# Patient Record
Sex: Male | Born: 1980 | Race: Black or African American | Hispanic: No | Marital: Single | Smoking: Current some day smoker
Health system: Southern US, Community
[De-identification: ages and names within clinical notes are randomized; demographics above are authoritative.]

## PROBLEM LIST (undated history)

## (undated) DIAGNOSIS — H00019 Hordeolum externum unspecified eye, unspecified eyelid: Secondary | ICD-10-CM

---

## 2009-11-27 ENCOUNTER — Emergency Department (HOSPITAL_COMMUNITY): Admission: EM | Admit: 2009-11-27 | Discharge: 2009-11-27 | Payer: Self-pay | Admitting: Emergency Medicine

## 2010-01-14 ENCOUNTER — Emergency Department (HOSPITAL_COMMUNITY): Admission: EM | Admit: 2010-01-14 | Discharge: 2010-01-14 | Payer: Self-pay | Admitting: Emergency Medicine

## 2010-10-18 ENCOUNTER — Emergency Department (HOSPITAL_COMMUNITY)
Admission: EM | Admit: 2010-10-18 | Discharge: 2010-10-18 | Disposition: A | Discharge status: Home / Self Care | Payer: Self-pay | Attending: Emergency Medicine | Admitting: Emergency Medicine

## 2010-10-18 DIAGNOSIS — K5289 Other specified noninfective gastroenteritis and colitis: Secondary | ICD-10-CM | POA: Insufficient documentation

## 2010-10-18 DIAGNOSIS — R197 Diarrhea, unspecified: Secondary | ICD-10-CM | POA: Insufficient documentation

## 2010-10-18 DIAGNOSIS — R1110 Vomiting, unspecified: Secondary | ICD-10-CM | POA: Insufficient documentation

## 2010-10-31 ENCOUNTER — Emergency Department (HOSPITAL_COMMUNITY)
Admission: EM | Admit: 2010-10-31 | Discharge: 2010-10-31 | Disposition: A | Discharge status: Home / Self Care | Payer: Self-pay | Attending: Emergency Medicine | Admitting: Emergency Medicine

## 2010-10-31 DIAGNOSIS — L42 Pityriasis rosea: Secondary | ICD-10-CM | POA: Insufficient documentation

## 2010-10-31 DIAGNOSIS — L2989 Other pruritus: Secondary | ICD-10-CM | POA: Insufficient documentation

## 2010-10-31 DIAGNOSIS — L298 Other pruritus: Secondary | ICD-10-CM | POA: Insufficient documentation

## 2011-06-19 ENCOUNTER — Emergency Department (HOSPITAL_COMMUNITY): Payer: Self-pay

## 2011-06-19 ENCOUNTER — Emergency Department (HOSPITAL_COMMUNITY)
Admission: EM | Admit: 2011-06-19 | Discharge: 2011-06-19 | Disposition: A | Discharge status: Home / Self Care | Payer: Self-pay | Attending: Emergency Medicine | Admitting: Emergency Medicine

## 2011-06-19 DIAGNOSIS — R059 Cough, unspecified: Secondary | ICD-10-CM | POA: Insufficient documentation

## 2011-06-19 DIAGNOSIS — A088 Other specified intestinal infections: Secondary | ICD-10-CM | POA: Insufficient documentation

## 2011-06-19 DIAGNOSIS — R197 Diarrhea, unspecified: Secondary | ICD-10-CM | POA: Insufficient documentation

## 2011-06-19 DIAGNOSIS — J3489 Other specified disorders of nose and nasal sinuses: Secondary | ICD-10-CM | POA: Insufficient documentation

## 2011-06-19 DIAGNOSIS — K117 Disturbances of salivary secretion: Secondary | ICD-10-CM | POA: Insufficient documentation

## 2011-06-19 DIAGNOSIS — R220 Localized swelling, mass and lump, head: Secondary | ICD-10-CM | POA: Insufficient documentation

## 2011-06-19 DIAGNOSIS — R112 Nausea with vomiting, unspecified: Secondary | ICD-10-CM | POA: Insufficient documentation

## 2011-06-19 DIAGNOSIS — R630 Anorexia: Secondary | ICD-10-CM | POA: Insufficient documentation

## 2011-06-19 DIAGNOSIS — A084 Viral intestinal infection, unspecified: Secondary | ICD-10-CM

## 2011-06-19 DIAGNOSIS — R05 Cough: Secondary | ICD-10-CM | POA: Insufficient documentation

## 2011-06-19 DIAGNOSIS — F17200 Nicotine dependence, unspecified, uncomplicated: Secondary | ICD-10-CM | POA: Insufficient documentation

## 2011-06-19 DIAGNOSIS — R109 Unspecified abdominal pain: Secondary | ICD-10-CM | POA: Insufficient documentation

## 2011-06-19 MED ORDER — ONDANSETRON 4 MG PO TBDP
8.0000 mg | ORAL_TABLET | Freq: Once | ORAL | Status: AC
  Administered 2011-06-19: 8 mg via ORAL
  Filled 2011-06-19: qty 1

## 2011-06-19 NOTE — ED Notes (Signed)
Pt resting quietly in bed. Pt tolerating fluids well. No vomiting noted.

## 2011-06-19 NOTE — ED Provider Notes (Signed)
History     CSN: [id number] Arrival date & time: 06/19/2011  3:46 PM   First MD Initiated Contact with Patient 06/19/11 1643      Chief Complaint  Patient presents with  . Abdominal Cramping    began yesterday, diahreea, abdominal cramping and n/v    (Consider location/radiation/quality/duration/timing/severity/associated sxs/prior treatment) Patient is a 30 y.o. male presenting with cramps. The history is provided by the patient.  Abdominal Cramping The primary symptoms of the illness include nausea, vomiting and diarrhea. The primary symptoms of the illness do not include abdominal pain or shortness of breath. The current episode started yesterday. The onset of the illness was sudden. The problem has not changed since onset. Vomiting occurs 2 to 5 times per day. The emesis contains stomach contents.  The diarrhea began today. The diarrhea is watery. The diarrhea occurs 2 to 4 times per day.  Additional symptoms associated with the illness include anorexia.    History reviewed. No pertinent past medical history.  History reviewed. No pertinent past surgical history.  History reviewed. No pertinent family history.  History  Substance Use Topics  . Smoking status: Current Everyday Smoker  . Smokeless tobacco: Not on file  . Alcohol Use: No      Review of Systems  HENT: Positive for congestion and rhinorrhea. Negative for ear discharge.   Respiratory: Positive for cough. Negative for shortness of breath.   Gastrointestinal: Positive for nausea, vomiting, diarrhea and anorexia. Negative for abdominal pain.  All other systems reviewed and are negative.    Allergies  Review of patient's allergies indicates no known allergies.  Home Medications   Current Outpatient Rx  Name Route Sig Dispense Refill  . THERA M PLUS PO TABS Oral Take 1 tablet by mouth daily.        BP 128/76  Pulse 75  Temp(Src) 97.8 F (36.6 C) (Oral)  Resp 14  Ht 5\' 10"  (1.778 m)  Wt 190 lb  (86.183 kg)  BMI 27.26 kg/m2  SpO2 100%  Physical Exam  Nursing note and vitals reviewed. Constitutional: He is oriented to person, place, and time. He appears well-developed and well-nourished. No distress.  HENT:  Head: Normocephalic and atraumatic.  Right Ear: Tympanic membrane and ear canal normal.  Left Ear: Tympanic membrane and ear canal normal.  Nose: Mucosal edema and rhinorrhea present.  Mouth/Throat: Mucous membranes are dry.  Eyes: Conjunctivae and EOM are normal. Pupils are equal, round, and reactive to light.  Neck: Normal range of motion. Neck supple.  Cardiovascular: Normal rate, regular rhythm and intact distal pulses.   No murmur heard. Pulmonary/Chest: Effort normal and breath sounds normal. No respiratory distress. He has no wheezes. He has no rales.  Abdominal: Soft. He exhibits no distension. There is no tenderness. There is no rebound and no guarding.  Musculoskeletal: Normal range of motion. He exhibits no edema and no tenderness.  Neurological: He is alert and oriented to person, place, and time.  Skin: Skin is warm and dry. No rash noted. No erythema.  Psychiatric: He has a normal mood and affect. His behavior is normal.    ED Course  Procedures (including critical care time)  Labs Reviewed - No data to display Dg Abd Acute W/chest  06/19/2011  *RADIOLOGY REPORT*  Clinical Data: Abdominal pain, nausea vomiting.  ACUTE ABDOMEN SERIES (ABDOMEN 2 VIEW & CHEST 1 VIEW)  Comparison: None  Findings: The upright chest x-ray is normal.  Two views of the abdomen demonstrate an unremarkable bowel  gas pattern.  There is scattered air in the colon and small bowel but no distention or air fluid levels.  Findings could be due to gastroenteritis.  No free air.  The soft tissue shadows are maintained.  The bony structures are intact.  IMPRESSION:  1.  No acute cardiopulmonary findings. 2.  No plain film findings for obstruction or perforation.  Original Report Authenticated  By: P. Loralie Champagne, M.D.     No diagnosis found.    MDM   Pt with symptoms most consistent with a viral process with respiratory sx/vomitting/diarrhea.  Denies bad food exposure and recent travel out of the country.  No recent abx.  No hx concerning for GU pathology or kidney stones.  Pt is awake and alert on exam without peritoneal signs.  AAS neg and after zofran tolerating po's.  Will d/c home to continue oral hydration.          Gwyneth Sprout, MD 06/19/11 [phone]

## 2011-10-20 ENCOUNTER — Encounter (HOSPITAL_COMMUNITY): Payer: Self-pay | Admitting: *Deleted

## 2011-10-20 ENCOUNTER — Emergency Department (HOSPITAL_COMMUNITY)
Admission: EM | Admit: 2011-10-20 | Discharge: 2011-10-20 | Disposition: A | Discharge status: Home / Self Care | Payer: Self-pay | Attending: Emergency Medicine | Admitting: Emergency Medicine

## 2011-10-20 DIAGNOSIS — R197 Diarrhea, unspecified: Secondary | ICD-10-CM | POA: Insufficient documentation

## 2011-10-20 DIAGNOSIS — IMO0001 Reserved for inherently not codable concepts without codable children: Secondary | ICD-10-CM | POA: Insufficient documentation

## 2011-10-20 DIAGNOSIS — J3489 Other specified disorders of nose and nasal sinuses: Secondary | ICD-10-CM | POA: Insufficient documentation

## 2011-10-20 DIAGNOSIS — J069 Acute upper respiratory infection, unspecified: Secondary | ICD-10-CM | POA: Insufficient documentation

## 2011-10-20 DIAGNOSIS — R509 Fever, unspecified: Secondary | ICD-10-CM | POA: Insufficient documentation

## 2011-10-20 DIAGNOSIS — R059 Cough, unspecified: Secondary | ICD-10-CM | POA: Insufficient documentation

## 2011-10-20 DIAGNOSIS — A6000 Herpesviral infection of urogenital system, unspecified: Secondary | ICD-10-CM | POA: Insufficient documentation

## 2011-10-20 DIAGNOSIS — R05 Cough: Secondary | ICD-10-CM | POA: Insufficient documentation

## 2011-10-20 DIAGNOSIS — R1110 Vomiting, unspecified: Secondary | ICD-10-CM | POA: Insufficient documentation

## 2011-10-20 MED ORDER — ACYCLOVIR 400 MG PO TABS: 400.0000 mg | ORAL_TABLET | Freq: Four times a day (QID) | ORAL | Status: AC

## 2011-10-20 MED ORDER — PROMETHAZINE HCL 25 MG PO TABS: 25.0000 mg | ORAL_TABLET | Freq: Four times a day (QID) | ORAL | Status: AC | PRN

## 2011-10-20 NOTE — Discharge Instructions (Signed)
Genital Herpes Genital herpes is a sexually transmitted disease. This means that it is a disease passed by having sex with an infected person. There is no cure for genital herpes. The time between attacks can be months to years. The virus may live in a person but produce no problems (symptoms). This infection can be passed to a baby as it travels down the birth canal (vagina). In a newborn, this can cause central nervous system damage, eye damage, or even death. The virus that causes genital herpes is usually HSV-2 virus. The virus that causes oral herpes is usually HSV-1. The diagnosis (learning what is wrong) is made through culture results. SYMPTOMS  Usually symptoms of pain and itching begin a few days to a week after contact. It first appears as small blisters that progress to small painful ulcers which then scab over and heal after several days. It affects the outer genitalia, birth canal, cervix, penis, anal area, buttocks, and thighs. HOME CARE INSTRUCTIONS   Keep ulcerated areas dry and clean.   Take medications as directed. Antiviral medications can speed up healing. They will not prevent recurrences or cure this infection. These medications can also be taken for suppression if there are frequent recurrences.   While the infection is active, it is contagious. Avoid all sexual contact during active infections.   Condoms may help prevent spread of the herpes virus.   Practice safe sex.   Wash your hands thoroughly after touching the genital area.   Avoid touching your eyes after touching your genital area.   Inform your caregiver if you have had genital herpes and become pregnant. It is your responsibility to insure a safe outcome for your baby in this pregnancy.   Only take over-the-counter or prescription medicines for pain, discomfort, or fever as directed by your caregiver.  SEEK MEDICAL CARE IF:   You have a recurrence of this infection.   You do not respond to medications and  are not improving.   You have new sources of pain or discharge which have changed from the original infection.   You have an oral temperature above 102 F (38.9 C).   You develop abdominal pain.   You develop eye pain or signs of eye infection.  Document Released: 07/18/2000 Document Revised: 07/10/2011 Document Reviewed: 08/08/2009 [employer] Patient Information 2012 [employer]. 

## 2011-10-20 NOTE — ED Provider Notes (Signed)
History     CSN: [id number]  Arrival date & time 10/20/11  1500   First MD Initiated Contact with Patient 10/20/11 1632      Chief Complaint  Patient presents with  . Cough  . Nasal Congestion     Patient is a 31 y.o. male presenting with cough. The history is provided by the patient.  Cough   the patient reports cough and congestion and myalgias and chills for 4 days.  He reports diarrhea.  He reports one episode of vomiting.  He denies chest pain or abdominal pain.  He has a fever at this time.  He reports no nausea at this time.  He reports a recent diagnosis of genital herpes and reports since developing his new upper respiratory tract infection he's had a significant outbreak of his genital herpes.  He no longer has acyclovir.  He requests a prescription for the acyclovir.  He has no primary care doctor.  Nothing worsens the symptoms.  Nothing improves his symptoms.  Symptoms are constant.  History reviewed. No pertinent past medical history.  History reviewed. No pertinent past surgical history.  History reviewed. No pertinent family history.  History  Substance Use Topics  . Smoking status: Current Everyday Smoker  . Smokeless tobacco: Not on file  . Alcohol Use: Yes      Review of Systems  Respiratory: Positive for cough.   All other systems reviewed and are negative.    Allergies  Review of patient's allergies indicates no known allergies.  Home Medications   Current Outpatient Rx  Name Route Sig Dispense Refill  . PSEUDOEPH-DOXYLAMINE-DM-APAP 60-7.12-31-998 MG/30ML PO LIQD Oral Take 30 mLs by mouth every 4 (four) hours as needed. For cold      BP 126/85  Pulse 86  Temp(Src) 98.1 F (36.7 C) (Oral)  Resp 16  SpO2 97%  Physical Exam  Nursing note and vitals reviewed. Constitutional: He is oriented to person, place, and time. He appears well-developed and well-nourished.  HENT:  Head: Normocephalic and atraumatic.       Posterior pharynx normal.   Tolerating secretions.  Eyes: EOM are normal.  Neck: Normal range of motion.  Cardiovascular: Normal rate, regular rhythm, normal heart sounds and intact distal pulses.   Pulmonary/Chest: Effort normal and breath sounds normal. No respiratory distress.  Abdominal: Soft. He exhibits no distension. There is no tenderness.  Musculoskeletal: Normal range of motion.  Neurological: He is alert and oriented to person, place, and time.  Skin: Skin is warm and dry.  Psychiatric: He has a normal mood and affect. Judgment normal.    ED Course  Procedures (including critical care time)  Labs Reviewed - No data to display No results found.   1. Upper respiratory tract infection   2. Genital herpes       MDM  Symptoms sound viral nature.  I suspect the reason he is really here is because of this herpetic outbreak.  He'll be sent home on treatment dose acyclovir.        Lyanne Co, MD 10/20/11 [phone]

## 2011-10-20 NOTE — ED Notes (Signed)
To ed for eval of cough and congestion for past 10 days. otc meds not working. Appears in nad

## 2012-03-10 ENCOUNTER — Emergency Department (HOSPITAL_COMMUNITY)
Admission: EM | Admit: 2012-03-10 | Discharge: 2012-03-10 | Disposition: A | Discharge status: Home / Self Care | Payer: Self-pay | Attending: Emergency Medicine | Admitting: Emergency Medicine

## 2012-03-10 ENCOUNTER — Encounter (HOSPITAL_COMMUNITY): Payer: Self-pay | Admitting: Emergency Medicine

## 2012-03-10 DIAGNOSIS — Z833 Family history of diabetes mellitus: Secondary | ICD-10-CM | POA: Insufficient documentation

## 2012-03-10 DIAGNOSIS — L03213 Periorbital cellulitis: Secondary | ICD-10-CM

## 2012-03-10 DIAGNOSIS — Z8249 Family history of ischemic heart disease and other diseases of the circulatory system: Secondary | ICD-10-CM | POA: Insufficient documentation

## 2012-03-10 DIAGNOSIS — H00039 Abscess of eyelid unspecified eye, unspecified eyelid: Secondary | ICD-10-CM | POA: Insufficient documentation

## 2012-03-10 DIAGNOSIS — F17200 Nicotine dependence, unspecified, uncomplicated: Secondary | ICD-10-CM | POA: Insufficient documentation

## 2012-03-10 HISTORY — DX: Hordeolum externum unspecified eye, unspecified eyelid: H00.019

## 2012-03-10 MED ORDER — CEPHALEXIN 500 MG PO CAPS: 500.0000 mg | ORAL_CAPSULE | Freq: Four times a day (QID) | ORAL | Status: DC

## 2012-03-10 NOTE — ED Notes (Signed)
R/eye lid swollen and red x 2 days

## 2012-03-10 NOTE — ED Provider Notes (Signed)
History     CSN: [id number]  Arrival date & time 03/10/12  1243   First MD Initiated Contact with Patient 03/10/12 1351      Chief Complaint  Patient presents with  . Facial Swelling    R/eye lid swollen and painful x 2 days    (Consider location/radiation/quality/duration/timing/severity/associated sxs/prior treatment) HPI Comments: Pt presents with right eyelid swelling. The swelling began three days ago and started as a small bump towards the center edge of his eyelid and has since become enlarged and swollen. Pt states his pain is a 5/10 and is only present when touching the area or when leaning forward. He has used ibuprofen and also a warm compress on the area but they have been ineffective at reducing swelling. Pt denies any loss or change in vision, pain or discharge in the eye, fever, ear pain, sore throat, sinus pressure or headache. The history is provided by the patient.    Past Medical History  Diagnosis Date  . Stye     History reviewed. No pertinent past surgical history.  Family History  Problem Relation Age of Onset  . Diabetes Other   . Hypertension Other     History  Substance Use Topics  . Smoking status: Current Some Day Smoker  . Smokeless tobacco: Not on file  . Alcohol Use: Yes      Review of Systems  Constitutional: Negative for fever and chills.  HENT: Negative for congestion, sore throat and sinus pressure.   Eyes: Negative for photophobia, pain, discharge, redness, itching and visual disturbance.  Respiratory: Negative for cough and shortness of breath.     Allergies  Review of patient's allergies indicates no known allergies.  Home Medications  No current outpatient prescriptions on file.  BP 117/65  Pulse 75  Temp 97.6 F (36.4 C) (Oral)  Resp 18  SpO2 98%  Physical Exam  Nursing note and vitals reviewed. Constitutional: He appears well-developed and well-nourished. No distress.  HENT:  Head: Normocephalic and atraumatic.    Eyes: Conjunctivae and EOM are normal. Pupils are equal, round, and reactive to light. Right eye exhibits no discharge. Left eye exhibits no discharge. Right conjunctiva is not injected. Right conjunctiva has no hemorrhage. Left conjunctiva is not injected. Left conjunctiva has no hemorrhage. No scleral icterus. Right eye exhibits normal extraocular motion and no nystagmus. Left eye exhibits normal extraocular motion and no nystagmus. Pupils are equal.       Right eyelid erythematous, edematous, tender to palpation.  No discharge.  Possible central nodule within edematous area.    Neck: Neck supple.  Pulmonary/Chest: Effort normal.  Neurological: He is alert.  Skin: He is not diaphoretic.    ED Course  Procedures (including critical care time)  Labs Reviewed - No data to display No results found.   1. Preseptal cellulitis       MDM  Pt with erythema, edema, tenderness of right eyelid.  Possible sty, but concern for preseptal cellulitis.  Clinically no concern for orbital cellulitis.  Discussed diagnosis and care instructions with patient.  Pt declines pain medication, states he would prefer to use ibuprofen, tylenol at home.  Pt given return precautions.  Pt verbalizes understanding and agrees with plan.           [place], [place] 03/10/12 1500

## 2012-03-11 ENCOUNTER — Encounter (HOSPITAL_COMMUNITY): Payer: Self-pay | Admitting: Emergency Medicine

## 2012-03-11 ENCOUNTER — Emergency Department (HOSPITAL_COMMUNITY)
Admission: EM | Admit: 2012-03-11 | Discharge: 2012-03-11 | Disposition: A | Discharge status: Home / Self Care | Payer: Self-pay | Attending: Emergency Medicine | Admitting: Emergency Medicine

## 2012-03-11 DIAGNOSIS — F17200 Nicotine dependence, unspecified, uncomplicated: Secondary | ICD-10-CM | POA: Insufficient documentation

## 2012-03-11 DIAGNOSIS — H00019 Hordeolum externum unspecified eye, unspecified eyelid: Secondary | ICD-10-CM | POA: Insufficient documentation

## 2012-03-11 NOTE — ED Provider Notes (Signed)
Medical screening examination/treatment/procedure(s) were performed by non-physician practitioner and as supervising physician I was immediately available for consultation/collaboration.   Loren Racer, MD 03/11/12 [phone]

## 2012-03-11 NOTE — ED Provider Notes (Signed)
Medical screening examination/treatment/procedure(s) were performed by non-physician practitioner and as supervising physician I was immediately available for consultation/collaboration.  Cathleen Yagi, MD 03/11/12 2346 

## 2012-03-11 NOTE — ED Notes (Signed)
Pt c/o right eye pain and swelling which began on Sunday. Pt reports coming to ED yesterday, however reports no relief in symptoms. Pt denies blurry or double vision. NAD

## 2012-03-11 NOTE — ED Provider Notes (Signed)
History     CSN: [id number]  Arrival date & time 03/11/12  1430   First MD Initiated Contact with Patient 03/11/12 1556      Chief Complaint  Patient presents with  . Facial Swelling    (Consider location/radiation/quality/duration/timing/severity/associated sxs/prior treatment) HPI Comments: 31 y/o male presents with right eyelid pain and swelling since Sunday. He was seen in the ED yesterday and prescribed keflex which he began last night. He has been applying warm compresses. 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 He believes this is the case and would like a note to be out of work. Denies any visual changes, blurred vision, double vision, loss of vision, headache, lightheadedness, dizziness, fever, chills, rashes, nausea.  The history is provided by the patient.    Past Medical History  Diagnosis Date  . Stye     History reviewed. No pertinent past surgical history.  Family History  Problem Relation Age of Onset  . Diabetes Other   . Hypertension Other     History  Substance Use Topics  . Smoking status: Current Some Day Smoker  . Smokeless tobacco: Not on file  . Alcohol Use: Yes      Review of Systems  Constitutional: Negative for fever and chills.  HENT: Negative for sore throat.   Eyes: Positive for pain. Negative for photophobia, discharge, redness and visual disturbance.  Gastrointestinal: Negative for nausea.  Skin: Negative for rash.  Neurological: Negative for dizziness, light-headedness and headaches.    Allergies  Review of patient's allergies indicates no known allergies.  Home Medications   Current Outpatient Rx  Name Route Sig Dispense Refill  . CEPHALEXIN 500 MG PO CAPS Oral Take 500 mg by mouth 4 (four) times daily.      BP 114/71  Pulse 83  Temp 98.3 F (36.8 C) (Oral)  Resp 18  Ht  5\' 10"  (1.778 m)  Wt 195 lb (88.451 kg)  BMI 27.98 kg/m2  SpO2 98%  Physical Exam  Nursing note and vitals reviewed. Constitutional: He is oriented to person, place, and time. He appears well-developed and well-nourished. No distress.  HENT:  Head: Normocephalic and atraumatic.  Nose: Nose normal.  Mouth/Throat: Oropharynx is clear and moist. No oropharyngeal exudate.  Eyes: Conjunctivae and EOM are normal. Pupils are equal, round, and reactive to light. Right eye exhibits hordeolum. Right eye exhibits no chemosis, no discharge and no exudate.       No periorbital edema  Neck: Neck supple.  Cardiovascular: Normal rate, regular rhythm and normal heart sounds.   Pulmonary/Chest: Effort normal and breath sounds normal.  Neurological: He is alert and oriented to person, place, and time.  Skin: No rash noted. There is erythema (over hordeolum).  Psychiatric: He has a normal mood and affect. His behavior is normal.    ED Course  Procedures (including critical care time)  Labs Reviewed - No data to display No results found.   1. Stye       MDM  31 y/o male with stye stating stye is not improving but not worsening. Has not been on abx for 24 hours yet. Admits he wants a work note. No visual disturbances present. No periorbital edema. Patient in NAD. Explained there are many causes for stye and not necessarily his work. Note given for today  only- patient aware. He will continue abx and warm compresses.        Trevor Mace, PA-C 03/11/12 1624

## 2012-03-22 ENCOUNTER — Encounter (HOSPITAL_COMMUNITY): Payer: Self-pay | Admitting: *Deleted

## 2012-03-22 ENCOUNTER — Emergency Department (HOSPITAL_COMMUNITY)
Admission: EM | Admit: 2012-03-22 | Discharge: 2012-03-22 | Disposition: A | Discharge status: Home / Self Care | Payer: Self-pay | Attending: Emergency Medicine | Admitting: Emergency Medicine

## 2012-03-22 DIAGNOSIS — J020 Streptococcal pharyngitis: Secondary | ICD-10-CM | POA: Insufficient documentation

## 2012-03-22 DIAGNOSIS — F17200 Nicotine dependence, unspecified, uncomplicated: Secondary | ICD-10-CM | POA: Insufficient documentation

## 2012-03-22 LAB — RAPID STREP SCREEN (MED CTR MEBANE ONLY): Streptococcus, Group A Screen (Direct): POSITIVE — AB

## 2012-03-22 MED ORDER — PENICILLIN G BENZATHINE 1200000 UNIT/2ML IM SUSP
1.2000 10*6.[IU] | Freq: Once | INTRAMUSCULAR | Status: AC
  Administered 2012-03-22: 1.2 10*6.[IU] via INTRAMUSCULAR
  Filled 2012-03-22: qty 2

## 2012-03-22 MED ORDER — HYDROCODONE-ACETAMINOPHEN 7.5-500 MG/15ML PO SOLN: 15.0000 mL | Freq: Four times a day (QID) | ORAL | Status: AC | PRN

## 2012-03-22 NOTE — ED Provider Notes (Signed)
History  This chart was scribed for Nathaniel Octave, MD by Shari Heritage. The patient was seen in room TR09C/TR09C. Patient's care was started at 1516.     CSN: [id number]  Arrival date & time 03/22/12  1516   First MD Initiated Contact with Patient 03/22/12 1549      Chief Complaint  Patient presents with  . Sore Throat    The history is provided by the patient. No language interpreter was used.   AN SCHNABEL is a 31 y.o. male who presents to the Emergency Department complaining of moderate to severe sore throat onset 4 days ago. Patient says that sore throat pain worsens when he swallows. Associated symptoms include body aches, mild fever and mild ear pain. Patient denies nausea or vomiting. No cough. Patient says that no OTC medications have provided relief. Patient has a recent history of a stye which he has been taking antibiotics for. He reports no other significant medical history.    Past Medical History  Diagnosis Date  . Stye     History reviewed. No pertinent past surgical history.  Family History  Problem Relation Age of Onset  . Diabetes Other   . Hypertension Other     History  Substance Use Topics  . Smoking status: Current Some Day Smoker  . Smokeless tobacco: Not on file  . Alcohol Use: Yes      Review of Systems A complete 10 system review of systems was obtained and all systems are negative except as noted in the HPI and PMH.   Allergies  Review of patient's allergies indicates no known allergies.  Home Medications  No current outpatient prescriptions on file.  BP 109/65  Pulse 86  Temp 99.1 F (37.3 C) (Oral)  Resp 18  SpO2 96%  Physical Exam  Constitutional: He is oriented to person, place, and time. He appears well-developed and well-nourished.  HENT:  Head: Normocephalic and atraumatic.  Mouth/Throat: Posterior oropharyngeal erythema present. No oropharyngeal exudate.       Mild erythema to the oropharynx. No asymmetry. No  exudate. No lymphadenopathy.  Musculoskeletal: Normal range of motion.  Neurological: He is alert and oriented to person, place, and time.  Skin: Skin is warm and dry.  Psychiatric: He has a normal mood and affect. His behavior is normal.    ED Course  Procedures (including critical care time) DIAGNOSTIC STUDIES: Oxygen Saturation is 96% on room air, adequate by my interpretation.    COORDINATION OF CARE: 3:52pm- Patient informed of current plan for treatment and evaluation and agrees with plan at this time. Took a culture to test for strep.   Labs Reviewed  RAPID STREP SCREEN - Abnormal; Notable for the following:    Streptococcus, Group A Screen (Direct) POSITIVE (*)     All other components within normal limits    No results found.   No diagnosis found.    MDM  Sore throat, body aches, subjective fevers and chills x4 days. Vital stable, no distress, no exudates or oropharyngeal asymmetry  Rapid strep positive.  No asymmetry. Tolerating PO. Will treat with bicillin.   I personally performed the services described in this documentation, which was scribed in my presence.  The recorded information has been reviewed and considered.    Nathaniel Octave, MD 03/22/12 [phone]

## 2012-03-22 NOTE — ED Notes (Addendum)
C/o sore throat, body aches, fever x 4 days. Very painful when swallowing. Denies cough, SOB.  Pain 10/10 when swallows

## 2012-03-22 NOTE — ED Notes (Signed)
Pt c/o sore throat x 4 days with fever and body aches

## 2012-08-20 IMAGING — CR DG ABDOMEN ACUTE W/ 1V CHEST
3 series · 3 of 3 positions shown · non-contrast
Comparison: None

CLINICAL DATA: Abdominal pain, nausea vomiting.

ACUTE ABDOMEN SERIES (ABDOMEN 2 VIEW & CHEST 1 VIEW)

[w chest pa *]
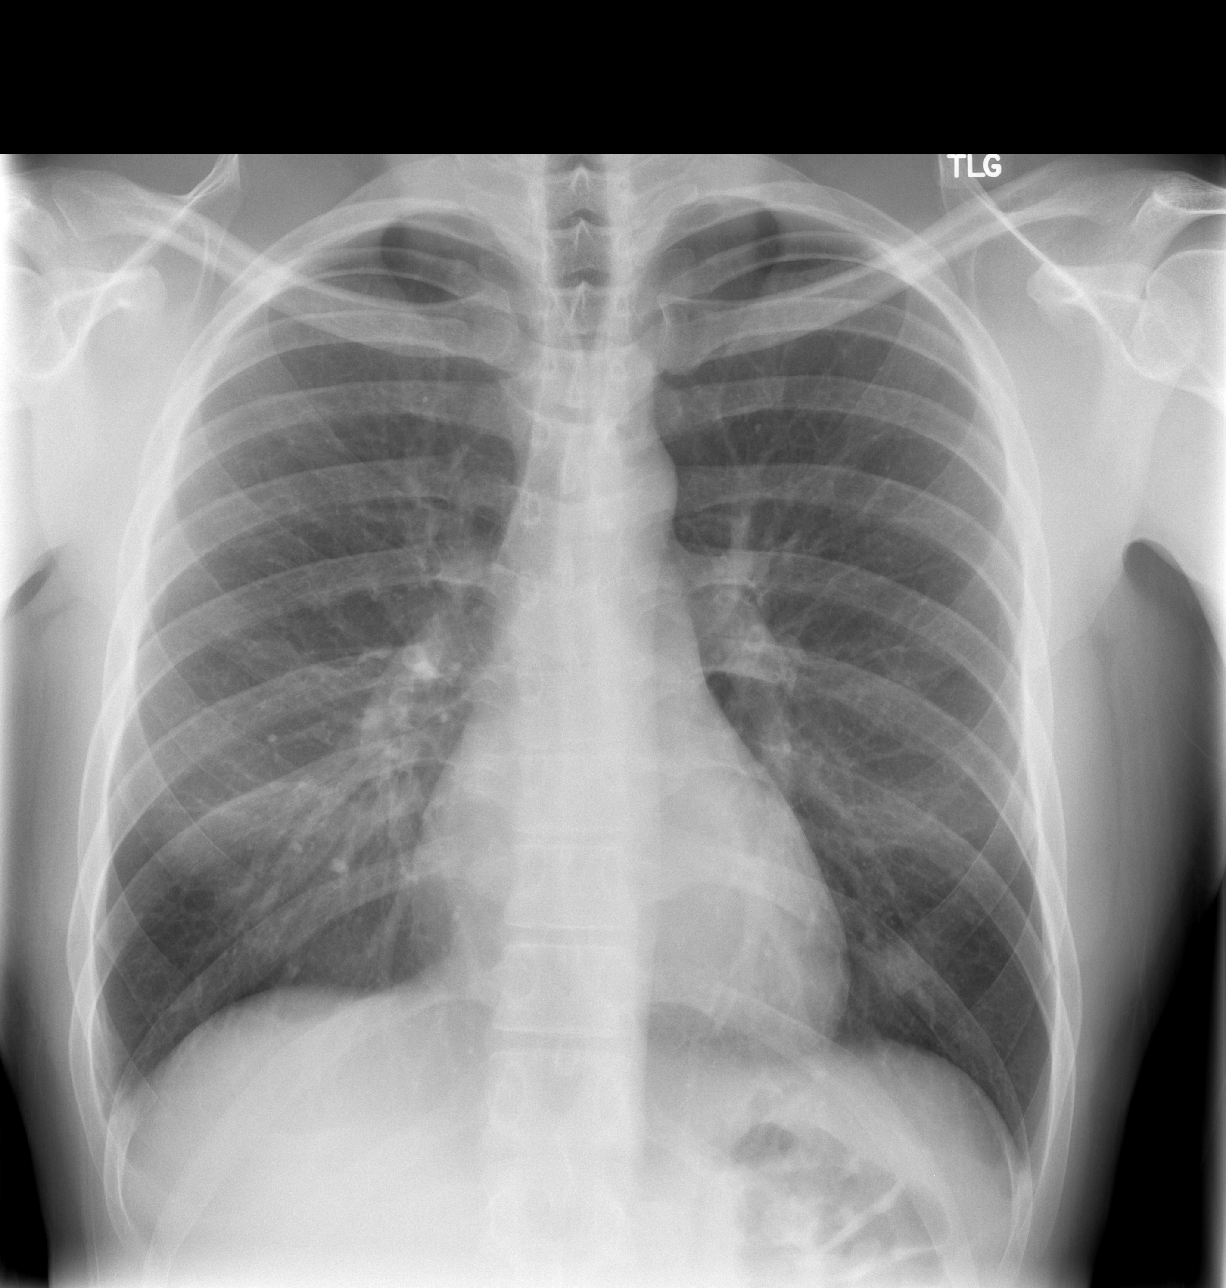

[w abdomen upright]
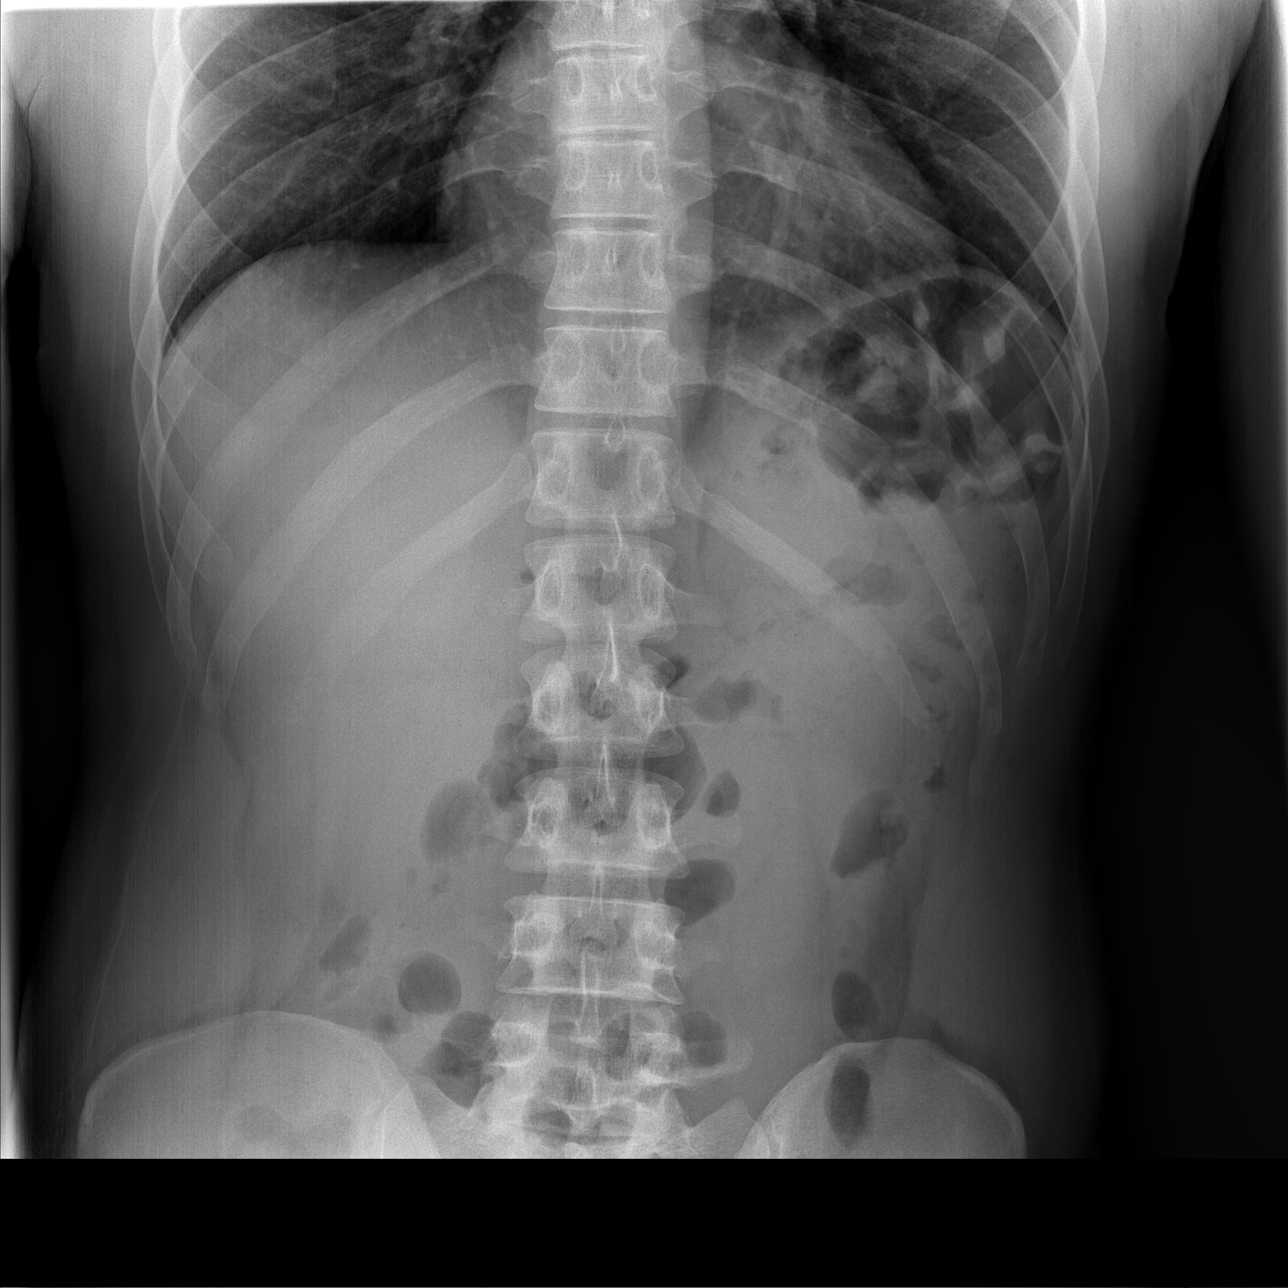

[t abdomen supine]
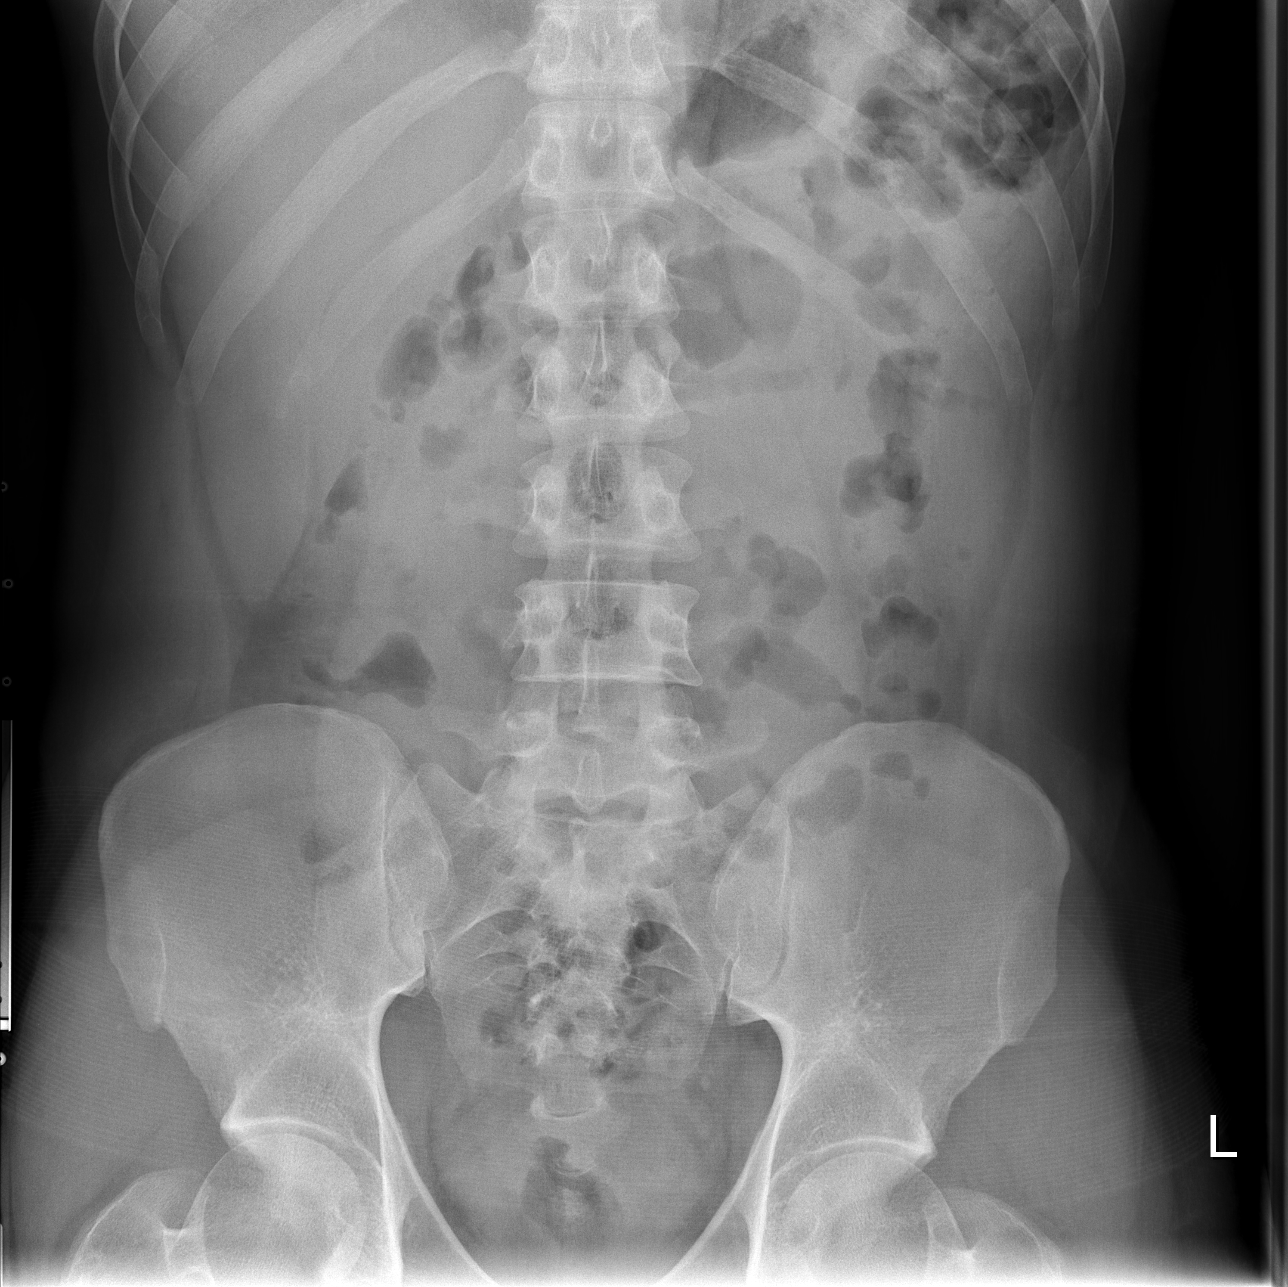

[3 of 3 positions shown; findings below may reference images not displayed]

FINDINGS: The upright chest x-ray is normal.

Two views of the abdomen demonstrate an unremarkable bowel gas
pattern.  There is scattered air in the colon and small bowel but
no distention or air fluid levels.  Findings could be due to
gastroenteritis.  No free air.  The soft tissue shadows are
maintained.  The bony structures are intact.
IMPRESSION: 1.  No acute cardiopulmonary findings.
2.  No plain film findings for obstruction or perforation.

## 2013-11-30 ENCOUNTER — Emergency Department (HOSPITAL_COMMUNITY)
Admission: EM | Admit: 2013-11-30 | Discharge: 2013-11-30 | Disposition: A | Discharge status: Home / Self Care | Payer: Self-pay | Attending: Emergency Medicine | Admitting: Emergency Medicine

## 2013-11-30 ENCOUNTER — Encounter (HOSPITAL_COMMUNITY): Payer: Self-pay | Admitting: Emergency Medicine

## 2013-11-30 DIAGNOSIS — R51 Headache: Secondary | ICD-10-CM | POA: Insufficient documentation

## 2013-11-30 DIAGNOSIS — F17200 Nicotine dependence, unspecified, uncomplicated: Secondary | ICD-10-CM | POA: Insufficient documentation

## 2013-11-30 DIAGNOSIS — H00023 Hordeolum internum right eye, unspecified eyelid: Secondary | ICD-10-CM

## 2013-11-30 DIAGNOSIS — H00029 Hordeolum internum unspecified eye, unspecified eyelid: Secondary | ICD-10-CM | POA: Insufficient documentation

## 2013-11-30 MED ORDER — ERYTHROMYCIN 5 MG/GM OP OINT: TOPICAL_OINTMENT | OPHTHALMIC | Status: AC

## 2013-11-30 MED ORDER — TETRACAINE HCL 0.5 % OP SOLN
2.0000 [drp] | Freq: Once | OPHTHALMIC | Status: AC
  Administered 2013-11-30: 2 [drp] via OPHTHALMIC
  Filled 2013-11-30: qty 2

## 2013-11-30 MED ORDER — FLUORESCEIN SODIUM 1 MG OP STRP
1.0000 | ORAL_STRIP | Freq: Once | OPHTHALMIC | Status: AC
  Administered 2013-11-30: 1 via OPHTHALMIC
  Filled 2013-11-30: qty 1

## 2013-11-30 NOTE — Discharge Instructions (Signed)
1. Medications: erythromycin, usual home medications 2. Treatment: rest, drink plenty of fluids,  3. Follow Up: Please followup with your primary doctor for discussion of your diagnoses and further evaluation after today's visit; if you do not have a primary care doctor use the resource guide provided to find one;     Sty A sty (hordeolum) is an infection of a gland in the eyelid located at the base of the eyelash. A sty may develop a white or yellow head of pus. It can be puffy (swollen). Usually, the sty will burst and pus will come out on its own. They do not leave lumps in the eyelid once they drain. A sty is often confused with another form of cyst of the eyelid called a chalazion. Chalazions occur within the eyelid and not on the edge where the bases of the eyelashes are. They often are red, sore and then form firm lumps in the eyelid. CAUSES   Germs (bacteria).  Lasting (chronic) eyelid inflammation. SYMPTOMS   Tenderness, redness and swelling along the edge of the eyelid at the base of the eyelashes.  Sometimes, there is a white or yellow head of pus. It may or may not drain. DIAGNOSIS  An ophthalmologist will be able to distinguish between a sty and a chalazion and treat the condition appropriately.  TREATMENT   Styes are typically treated with warm packs (compresses) until drainage occurs.  In rare cases, medicines that kill germs (antibiotics) may be prescribed. These antibiotics may be in the form of drops, cream or pills.  If a hard lump has formed, it is generally necessary to do a small incision and remove the hardened contents of the cyst in a minor surgical procedure done in the office.  In suspicious cases, your caregiver may send the contents of the cyst to the lab to be certain that it is not a rare, but dangerous form of cancer of the glands of the eyelid. HOME CARE INSTRUCTIONS   Wash your hands often and dry them with a clean towel. Avoid touching your eyelid.  This may spread the infection to other parts of the eye.  Apply heat to your eyelid for 10 to 20 minutes, several times a day, to ease pain and help to heal it faster.  Do not squeeze the sty. Allow it to drain on its own. Wash your eyelid carefully 3 to 4 times per day to remove any pus. SEEK IMMEDIATE MEDICAL CARE IF:   Your eye becomes painful or puffy (swollen).  Your vision changes.  Your sty does not drain by itself within 3 days.  Your sty comes back within a short period of time, even with treatment.  You have redness (inflammation) around the eye.  You have a fever. Document Released: 04/30/2005 Document Revised: 10/13/2011 Document Reviewed: 01/02/2009 [facility] Patient Information 2014 [employer], [place].

## 2013-11-30 NOTE — ED Notes (Signed)
Pt presents to department for evaluation of R eye pain. States he feels like something is stuck in R eye. 5/10 pain at the time. Pt is alert and oriented x4.

## 2013-11-30 NOTE — ED Provider Notes (Signed)
Medical screening examination/treatment/procedure(s) were performed by non-physician practitioner and as supervising physician I was immediately available for consultation/collaboration.   EKG Interpretation None       Ethelda ChickMartha K Linker, MD 11/30/13 [phone]

## 2013-11-30 NOTE — ED Provider Notes (Signed)
CSN: [id number]     Arrival date & time 11/30/13  1741 History  This chart was scribed for non-physician practitioner Dierdre ForthHannah Takako Minckler, PA-C working with Ethelda ChickMartha K Linker, MD by Dorothey Basemania Sutton, ED Scribe. This patient was seen in room TR04C/TR04C and the patient's care was started at 7:08 PM.    Chief Complaint  Patient presents with  . Eye Pain   The history is provided by the patient and medical records. No language interpreter was used.   HPI Comments: Nathaniel FlingHoward J Danko is a 33 y.o. male with a history of styes who presents to the Emergency Department complaining of a constant pain, described as a foreign body sensation and rated 5/10 currently, to the right eye onset earlier today. He states his current pain feels similar to his prior styes, but that he does not see one in the area. He reports some tearing from the eye with associated blurred vision and an associated, diffuse headache. He denies fever, chills. Patient has no other pertinent medical history.   Past Medical History  Diagnosis Date  . Stye    History reviewed. No pertinent past surgical history. Family History  Problem Relation Age of Onset  . Diabetes Other   . Hypertension Other    History  Substance Use Topics  . Smoking status: Current Some Day Smoker    Types: Cigarettes  . Smokeless tobacco: Not on file  . Alcohol Use: Yes     Comment: social    Review of Systems  Constitutional: Negative for fever and chills.  Eyes: Positive for pain and visual disturbance.  Neurological: Positive for headaches.  All other systems reviewed and are negative.  Allergies  Review of patient's allergies indicates no known allergies.  Home Medications   Prior to Admission medications   Not on File   Triage Vitals: BP 111/78  Pulse 79  Temp(Src) 97.7 F (36.5 C) (Oral)  Resp 18  SpO2 96%  Physical Exam  Nursing note and vitals reviewed. Constitutional: He appears well-developed and well-nourished. No distress.   Awake, alert, nontoxic appearance  HENT:  Head: Normocephalic and atraumatic.  Mouth/Throat: Oropharynx is clear and moist. No oropharyngeal exudate.  Eyes: Conjunctivae, EOM and lids are normal. Pupils are equal, round, and reactive to light. Lids are everted and swept, no foreign bodies found. Right eye exhibits hordeolum. Right eye exhibits no chemosis, no discharge and no exudate. No foreign body present in the right eye. Left eye exhibits no chemosis, no discharge, no exudate and no hordeolum. No foreign body present in the left eye. Right conjunctiva is not injected. Left conjunctiva is not injected. No scleral icterus. Right eye exhibits normal extraocular motion.  Fundoscopic exam:      The right eye shows no arteriolar narrowing, no AV nicking, no exudate, no hemorrhage and no papilledema.       The left eye shows no arteriolar narrowing, no AV nicking, no exudate, no hemorrhage and no papilledema.  Slit lamp exam:      The right eye shows no corneal abrasion, no corneal flare, no corneal ulcer, no foreign body, no fluorescein uptake and no anterior chamber bulge.       The left eye shows no corneal abrasion, no corneal flare, no corneal ulcer, no foreign body, no fluorescein uptake and no anterior chamber bulge.  Normal extraocular movements without pain or diplopia. Internal hordeolum to the right, upper.   Neck: Normal range of motion. Neck supple.  Cardiovascular: Normal rate, regular rhythm and  intact distal pulses.   Pulmonary/Chest: Effort normal and breath sounds normal. No respiratory distress. He has no wheezes.  Abdominal: Soft. Bowel sounds are normal. He exhibits no mass. There is no tenderness. There is no rebound and no guarding.  Musculoskeletal: Normal range of motion. He exhibits no edema.  Neurological: He is alert.  Speech is clear and goal oriented Moves extremities without ataxia  Skin: Skin is warm and dry. He is not diaphoretic.  Psychiatric: He has a normal  mood and affect.    ED Course  Procedures (including critical care time)  DIAGNOSTIC STUDIES: Oxygen Saturation is 96% on room air, normal by my interpretation.    COORDINATION OF CARE: 7:18 PM- Applied tetracaine and fluorescein. Will discharge patient with antibiotics. Advised patient to follow up with the referred ophthalmologist. Discussed treatment plan with patient at bedside and patient verbalized agreement.     Labs Review Labs Reviewed - No data to display  Imaging Review No results found.   EKG Interpretation None      MDM   Final diagnoses:  Internal hordeolum of right eye    Nathaniel Frey presents with FB sensation in the right eye. Pt denies known injury.  No foreign body noted on slit-lamp, no corneal abrasion or corneal ulcer. Visible internal hordeolum of the right upper lid.  Patient will be given erythromycin ointment. Recommend warm compresses and close followup with ophthalmology.  Patient without evidence of orbital cellulitis. No diplopia or vision changes. No evidence of iritis.  It has been determined that no acute conditions requiring further emergency intervention are present at this time. The patient/guardian have been advised of the diagnosis and plan. We have discussed signs and symptoms that warrant return to the ED, such as changes or worsening in symptoms.   Vital signs are stable at discharge.   BP 111/78  Pulse 79  Temp(Src) 97.7 F (36.5 C) (Oral)  Resp 18  SpO2 96%  Patient/guardian has voiced understanding and agreed to follow-up with the PCP or specialist.  I personally performed the services described in this documentation, which was scribed in my presence. The recorded information has been reviewed and is accurate.   Dahlia ClientHannah Vincient Vanaman, PA-C 11/30/13 1952

## 2014-03-27 ENCOUNTER — Encounter (HOSPITAL_COMMUNITY): Payer: Self-pay | Admitting: Emergency Medicine

## 2014-03-27 ENCOUNTER — Emergency Department (HOSPITAL_COMMUNITY)
Admission: EM | Admit: 2014-03-27 | Discharge: 2014-03-28 | Disposition: A | Discharge status: Home / Self Care | Payer: Self-pay | Attending: Emergency Medicine | Admitting: Emergency Medicine

## 2014-03-27 DIAGNOSIS — F17200 Nicotine dependence, unspecified, uncomplicated: Secondary | ICD-10-CM | POA: Insufficient documentation

## 2014-03-27 DIAGNOSIS — R21 Rash and other nonspecific skin eruption: Secondary | ICD-10-CM | POA: Insufficient documentation

## 2014-03-27 DIAGNOSIS — Z8669 Personal history of other diseases of the nervous system and sense organs: Secondary | ICD-10-CM | POA: Insufficient documentation

## 2014-03-27 DIAGNOSIS — L237 Allergic contact dermatitis due to plants, except food: Secondary | ICD-10-CM

## 2014-03-27 DIAGNOSIS — L255 Unspecified contact dermatitis due to plants, except food: Secondary | ICD-10-CM | POA: Insufficient documentation

## 2014-03-27 NOTE — ED Notes (Signed)
Rash present on bilateral fore arms.

## 2014-03-28 MED ORDER — HYDROXYZINE HCL 25 MG PO TABS: 25.0000 mg | ORAL_TABLET | Freq: Four times a day (QID) | ORAL | Status: AC

## 2014-03-28 MED ORDER — HYDROCORTISONE 1 % EX CREA: 1.0000 "application " | TOPICAL_CREAM | Freq: Two times a day (BID) | CUTANEOUS | Status: AC

## 2014-03-28 NOTE — ED Provider Notes (Signed)
Medical screening examination/treatment/procedure(s) were performed by non-physician practitioner and as supervising physician I was immediately available for consultation/collaboration.   EKG Interpretation None        Tomasita Crumble, MD 03/28/14 1843

## 2014-03-28 NOTE — ED Provider Notes (Signed)
CSN: [id number]     Arrival date & time 03/27/14  2153 History   First MD Initiated Contact with Patient 03/27/14 2157     Chief Complaint  Patient presents with  . Rash     (Consider location/radiation/quality/duration/timing/severity/associated sxs/prior Treatment) HPI Comments: The patient is a 33 year old male presents emergency room chief complaint of rash to bilateral upper extremities for approximately one week. Patient reports onset of rash after doing yard work, pulling down vines and cutting down trees. He reports pleuritis at the site. Denies fever, chills. Denies lesions to face or genitals.  Patient is a 33 y.o. male presenting with rash. The history is provided by the patient. No language interpreter was used.  Rash Associated symptoms: no fever     Past Medical History  Diagnosis Date  . Stye    History reviewed. No pertinent past surgical history. Family History  Problem Relation Age of Onset  . Diabetes Other   . Hypertension Other    History  Substance Use Topics  . Smoking status: Current Some Day Smoker    Types: Cigarettes  . Smokeless tobacco: Not on file  . Alcohol Use: Yes     Comment: social    Review of Systems  Constitutional: Negative for fever and chills.  Skin: Positive for rash.      Allergies  Review of patient's allergies indicates no known allergies.  Home Medications   Prior to Admission medications   Medication Sig Start Date End Date Taking? Authorizing Provider  erythromycin ophthalmic ointment Place a 1/2 inch ribbon of ointment into the lower eyelid. 11/30/13   Hannah Muthersbaugh, PA-C   BP 116/73  Pulse 82  Temp(Src) 98 F (36.7 C) (Oral)  Resp 18  SpO2 98% Physical Exam  Nursing note and vitals reviewed. Constitutional: He is oriented to person, place, and time. He appears well-developed and well-nourished. No distress.  HENT:  Head: Normocephalic and atraumatic.  Neck: Neck supple.  Pulmonary/Chest: Effort  normal. No respiratory distress.  Neurological: He is oriented to person, place, and time.  Skin: Skin is warm and dry. Rash noted. He is not diaphoretic.  Multiple grouped vesicles to bilateral upper extremities, no weeping or edema.  Psychiatric: He has a normal mood and affect. His behavior is normal.    ED Course  Procedures (including critical care time) Labs Review Labs Reviewed - No data to display  Imaging Review No results found.   EKG Interpretation None      MDM   Final diagnoses:  Poison ivy dermatitis   Patient presents with bilateral upper extremity rash, after laying down trees and ivy. Exam consistent with contact dermatitis likely to poison ivy. Will treat symptomatically. No signs of secondary infection. Discussed treatment plan with the patient. Return precautions given. Reports understanding and no other concerns at this time.  Patient is stable for discharge at this time.  Meds given in ED:  Medications - No data to display  Discharge Medication List as of 03/28/2014 12:22 AM    START taking these medications   Details  hydrocortisone cream 1 % Apply 1 application topically 2 (two) times daily. Do not apply to face, Starting 03/28/2014, Until Discontinued, Print    hydrOXYzine (ATARAX/VISTARIL) 25 MG tablet Take 1 tablet (25 mg total) by mouth every 6 (six) hours., Starting 03/28/2014, Until Discontinued, Print            Mellody Drown, PA-C 03/28/14 [phone]

## 2014-03-28 NOTE — ED Notes (Signed)
Declined W/C at D/C and was escorted to lobby by RN. 

## 2014-03-28 NOTE — Discharge Instructions (Signed)
Call for a follow up appointment with a Family or Primary Care Provider.  Return if Symptoms worsen.   Take medication as prescribed.  Do not scratch the rash.   Emergency Department Resource Guide 1) Find a Doctor and Pay Out of Pocket Although you won't have to find out who is covered by your insurance plan, it is a good idea to ask around and get recommendations. You will then need to call the office and see if the doctor you have chosen will accept you as a new patient and what types of options they offer for patients who are self-pay. Some doctors offer discounts or will set up payment plans for their patients who do not have insurance, but you will need to ask so you aren't surprised when you get to your appointment.  2) Contact Your Local Health Department Not all health departments have doctors that can see patients for sick visits, but many do, so it is worth a call to see if yours does. If you don't know where your local health department is, you can check in your phone book. The CDC also has a tool to help you locate your state's health department, and many state websites also have listings of all of their local health departments.  3) Find a Walk-in Clinic If your illness is not likely to be very severe or complicated, you may want to try a walk in clinic. These are popping up all over the country in pharmacies, drugstores, and shopping centers. They're usually staffed by nurse practitioners or physician assistants that have been trained to treat common illnesses and complaints. They're usually fairly quick and inexpensive. However, if you have serious medical issues or chronic medical problems, these are probably not your best option.  No Primary Care Doctor: - Call Health Connect at  [phone] - they can help you locate a primary care doctor that  accepts your insurance, provides certain services, etc. - Physician Referral Service- [phone]  Chronic Pain  Problems: Organization         Address  Phone   Notes  [facility]  [phone] Patients need to be referred by their primary care doctor.   Medication Assistance: Organization         Address  Phone   Notes  [facility] Medication [facility] [address] [place] [place], [place] [address] [phone] --Must be a resident of [facility] -- Must have NO insurance coverage whatsoever (no Medicaid/ Medicare, etc.) -- The pt. MUST have a primary care doctor that directs their care regularly and follows them in the community   Med[facility]ist  [phone]   [employer]  [phone]    Agencies that provide inexpensive medical care: Organization         Address  Phone   Notes  [facility]  [phone]   [facility]    [phone]   [facility] [address] [place], [place] [address] [phone]   [facility] [address] [place] [place] [phone]   Planned Parenthood    [phone]   [facility]    [phone]   [facility]  [address], [place] Phone:  [phone], Fax:  [phone] Hours of Operation:  9 am - 6 pm, M-F.  Also accepts Medicaid/Medicare and self-pay.  [facility]  [address], [place] Phone: [phone], Fax: [phone]. Hours of Operation:  8:30 am - 5:30 pm, M-F.  Also accepts Medicaid and self-pay.  [facility] [address], [place] Phone: [phone]   [place], [place], [place] [phone], Ext. [phone] Mondays & Thursdays: 7-9 AM.  First 15 patients are seen on a first come, first serve basis.    [place] Providers:  Organization         Address  Phone   Notes  [facility] [address], [place] [phone] Also  accepts self-pay patients.  [facility] [address], [place]  [phone]   [place], [address], [place] [phone]   [facility] [address] [place] [phone]   Lucianne Lei [address], [place]   [phone] Only accepts [place] Access [place] patients after they have their name applied to their card.   Self-Pay (no insurance) in [facility]:  Organization         Address  Phone   Notes  Sickle Cell Patients, [facility] Internal Medicine [employer] [phone]   [facility]gent Care [place] [phone]   [facility] [place]  [place], [place], [place] [phone]   [facility]/Dr. Osei-Bonsu  [address] [place] or [address], [place] [phone] Phone number for both [place] and [place] locations is the same.  [facility] [address], [place] [phone]   [facility] [address] [place] or [address] [phone]   [facility] [address] [place] [phone], phone; [phone], fax Sees patients 1st and 3rd Saturday of every month.  Must not qualify for public or private insurance (i.e. Medicaid, Medicare, [place] Health Choice, Veterans' Benefits)  Household income should be no more than 200% of the poverty level The clinic cannot treat you if you are pregnant or think you are pregnant  Sexually transmitted diseases are not treated at the clinic.    Dental Care: Organization         Address  Phone  Notes  [facility] Department of [facility] [phone] Accepts children up to age 58 who are enrolled in [place] or [place]; pregnant  women with a Medicaid card; and children who have applied for Medicaid or [place] Health Choice, but were declined, whose parents can pay a reduced fee at time of service.  [facility] Department of [facility]  [address], [place] [phone] Accepts children up to age 52 who are enrolled in [place] or [place]; pregnant women with a Medicaid card; and children who have applied for Medicaid or [place] Health Choice, but were declined, whose parents can pay a reduced fee at time of service.  [facility]  [place] [phone] Patients are seen by appointment only. Walk-ins are not accepted. [place] will see patients 8 years of age and older. Monday - Tuesday (8am-5pm) Most Wednesdays (8:30-5pm) $30 per visit, cash only  [place]  PROGRAM  [address], [facility] [phone] Patients are seen by appointment only. Walk-ins are not accepted. [place] will see patients 73 years of age and older. One Wednesday Evening (Monthly: Volunteer Based).  $30 per visit, cash only  [place]  [phone] for adults; Children under age 67, call Graduate Pediatric Dentistry at [phone]. Children aged 69-14, please call [phone] to request a pediatric application.  Dental services are provided in all areas of dental care including fillings, crowns and bridges, complete and partial dentures, implants, gum treatment, root canals, and extractions. Preventive care is also provided. Treatment is provided to both adults and children. Patients are selected via a lottery and there is often a waiting list.   [facility] [address], [place]  [phone] [email or web address]   [facility] [address] [place], [place] [phone], Ext. [phone] Second and Fourth Thursday of each month, opens at 6:30 AM; Clinic ends at 9 AM.  Patients are  seen on a first-come first-served basis, and a limited number are seen during each clinic.   [facility]  [address] [place] Danker [place], [place] [phone]   Eligibility Requirements You must have lived in [place], [place], or [place] counties for at least the last three months.   You cannot be eligible for state or federal sponsored [employer], including [employer], [place], or [employer].   You generally cannot be eligible for healthcare insurance through your employer.    How to apply: Eligibility screenings are held every Tuesday and Wednesday afternoon from 1:00 pm until 4:00 pm. You do not need an appointment for the interview!  [facility] [address], [place], [place]   [place] Department  [place]  [phone]    Behavioral Health Resources in the Community: Intensive Outpatient Programs Organization         Address  Phone  Notes  [place]. [address], [place], [place] [phone]   [facility] Outpatient [address] [place], [place]   ADS: [facility] [address], [place], [place]   [place] [address]  [place], [place] or [phone]   Substance Abuse Resources Organization         Address  Phone  Notes  Al[facility] Drug Services  [phone]   [place]  [phone]   [facility] [place]   Chinita Pester  [phone]   Residential & Outpatient Substance Abuse Program  [phone]   Psychological Services Organization         Address  Phone  Notes  [facility] [place]  [place]  [phone]   [place] [address], [place] or [phone]    Mobile Crisis  Teams Organization         Address  Phone  Notes  [facility], Mobile Crisis Care Unit  [phone]   [facility]  [address] [place], [place]   [place] [address], [place] [phone]    Self-Help/Support Groups Organization         Address  Phone             Notes  [place]  of [place] - variety of support groups  336- I7437963 Call for more information  Narcotics Anonymous (NA), [facility] [address], [employer] [place]  2 meetings at this location   Statistician         Address  Phone  Notes  [facility] [address],    [place]  [phone]   [facility]  [address] [place] [address], [place], [place] [phone]   [facility] [address] [place], [place] [phone] Admissions: 8am-3pm M-F  [facility] [address],    [place], [place] [phone]   [facility] [address] [place], [place], [place] [phone]   [facility] [address]  [place], [place] [phone]   [facility] - Intensive Outpatient [address]urell [address], [place], [place] [phone]   [facility] ([facility] [address] [place]  [place], [place] [phone] or [phone]   [facility] ([facility]) [address], [place], [place] [phone] Accepts Medicaid  [facility] [place] [address],  [place] [phone] Substance Abuse/Addiction Treatment    [facility] Organization         Address  Phone  Notes  [facility]  [phone]   Angie Fava, PhD [address] Ervin Knack [place], [place]   [phone] or [phone]   [facility]   [address] [place], [place] [phone]   [facility] [address] [place], [place] [phone]  Insurance/Medicaid/sponsorship through [facility] [address]                                    [place], [place] [phone] Therapy/tele-psych/case  [facility] [address] [place], [place] [phone]    Dr. Lolly Mustache  [phone]   [facility]  [employer] [facility] Dept. 1) [address], [place] 2) [address] [place] 3)  [address], [place] [phone]  [phone]   [facility] Child Abuse Hotline 312-699-3368 or [phone] (After Hours)
